# Patient Record
Sex: Male | Born: 2000 | Race: Black or African American | Hispanic: No | Smoking: Never smoker
Health system: Southern US, Community
[De-identification: ages and names within clinical notes are randomized; demographics above are authoritative.]

---

## 2021-02-06 ENCOUNTER — Ambulatory Visit (INDEPENDENT_AMBULATORY_CARE_PROVIDER_SITE_OTHER): Payer: 59

## 2021-02-06 ENCOUNTER — Other Ambulatory Visit: Payer: Self-pay

## 2021-02-06 ENCOUNTER — Ambulatory Visit
Admission: EM | Admit: 2021-02-06 | Discharge: 2021-02-06 | Disposition: A | Discharge status: Home / Self Care | Payer: 59 | Attending: Physician Assistant | Admitting: Physician Assistant

## 2021-02-06 DIAGNOSIS — R0781 Pleurodynia: Secondary | ICD-10-CM | POA: Diagnosis not present

## 2021-02-06 DIAGNOSIS — M25561 Pain in right knee: Secondary | ICD-10-CM

## 2021-02-06 DIAGNOSIS — W109XXA Fall (on) (from) unspecified stairs and steps, initial encounter: Secondary | ICD-10-CM | POA: Diagnosis not present

## 2021-02-06 DIAGNOSIS — W19XXXA Unspecified fall, initial encounter: Secondary | ICD-10-CM | POA: Diagnosis not present

## 2021-02-06 DIAGNOSIS — R0782 Intercostal pain: Secondary | ICD-10-CM | POA: Diagnosis not present

## 2021-02-06 NOTE — ED Triage Notes (Signed)
Pt states he fell down the stairs last night inside stairs, Left rib pain and right knee pain. C/O little SOB since then. Denies LOC

## 2021-02-06 NOTE — Discharge Instructions (Addendum)
-  Can use ibuprofen or Tylenol as needed for pain -Chest wall tenderness should be better in a few days.  No acute fractures noted. -Ice and warm compresses as needed for discomfort

## 2021-02-06 NOTE — ED Provider Notes (Signed)
[facility]    CSN: [id number] Arrival date & time: 02/06/21  0818      History   Chief Complaint Chief Complaint  Patient presents with   Rib Injury    left   Knee Pain    right    HPI Connor Allen is a 20 y.o. male.   Patient is a 20 year old male who presents with chief complaint of left rib pain and right knee pain after falling down a flight of stairs last night.  Patient dates she tripped and states he thinks her stair hitting him in the left side of the chest.  Patient denies any LOC.  Patient reports left rib pain is worse with deep breath and that his right knee does hurt worse with flexion and extension.   No past medical history on file.  There are no problems to display for this patient.  No relevant past medical history.   Home Medications    Prior to Admission medications   Not on File    Family History No family history on file.  Social History Social History   Tobacco Use   Smoking status: Never   Smokeless tobacco: Never  Vaping Use   Vaping Use: Never used  Substance Use Topics   Alcohol use: Not Currently   Drug use: Not Currently     Allergies   Patient has no allergy information on record.   Review of Systems Review of Systems as noted in HPI, other systems reviewed and found to be negative   Physical Exam Triage Vital Signs ED Triage Vitals  Enc Vitals Group     BP 02/06/21 0831 (!) 138/102     Pulse Rate 02/06/21 0831 77     Resp 02/06/21 0831 18     Temp 02/06/21 0831 98.6 F (37 C)     Temp Source 02/06/21 0831 Oral     SpO2 02/06/21 0831 100 %     Weight 02/06/21 0829 175 lb (79.4 kg)     Height 02/06/21 0829 5\' 9"  (1.753 m)     Head Circumference --      Peak Flow --      Pain Score 02/06/21 0828 6     Pain Loc --      Pain Edu? --      Excl. in GC? --    No data found.  Updated Vital Signs BP (!) 138/102 (BP Location: Right Arm)   Pulse 77   Temp 98.6 F (37 C) (Oral)   Resp 18   Ht 5'  9" (1.753 m)   Wt 175 lb (79.4 kg)   SpO2 100%   BMI 25.84 kg/m   Physical Exam Constitutional:      General: He is not in acute distress.    Appearance: Normal appearance. He is normal weight.  HENT:     Head: Normocephalic and atraumatic.  Cardiovascular:     Rate and Rhythm: Normal rate and regular rhythm.     Pulses: Normal pulses.  Pulmonary:     Effort: Pulmonary effort is normal. No respiratory distress.     Breath sounds: Normal breath sounds. No stridor.  Chest:    Musculoskeletal:        General: Normal range of motion.     Right knee: Normal.     Left knee: Normal.  Skin:    General: Skin is warm and dry.  Neurological:     Mental Status: He is alert.  UC Treatments / Results  Labs (all labs ordered are listed, but only abnormal results are displayed) Labs Reviewed - No data to display  EKG   Radiology DG Ribs Unilateral W/Chest Left  Result Date: 02/06/2021 CLINICAL DATA:  Fall down stairs, left rib pain EXAM: LEFT RIBS AND CHEST - 3+ VIEW COMPARISON:  None. FINDINGS: The cardiomediastinal silhouette is within normal limits. No pleural effusion. No pneumothorax. No mass or consolidation. No acute osseous abnormality. No displaced rib fracture. Small round metallic density overlying the left chest appears to be external to the patient. IMPRESSION: Normal chest radiograph.  No displaced rib fracture. Electronically Signed   By: Olive Bass M.D.   On: 02/06/2021 09:31   DG Knee AP/LAT W/Sunrise Right  Result Date: 02/06/2021 CLINICAL DATA:  Fall down stairs, right knee pain EXAM: RIGHT KNEE 3 VIEWS COMPARISON:  None. FINDINGS: Normal alignment. No acute fracture. Normal mineralization. The soft tissues are unremarkable. No knee joint effusion. IMPRESSION: No malalignment or acute fracture. Electronically Signed   By: Olive Bass M.D.   On: 02/06/2021 09:27    Procedures Procedures (including critical care time)  Medications Ordered in  UC Medications - No data to display  Initial Impression / Assessment and Plan / UC Course  I have reviewed the triage vital signs and the nursing notes.  Pertinent labs & imaging results that were available during my care of the patient were reviewed by me and considered in my medical decision making (see chart for details).  Clinical Course as of 02/06/21 0940  Mon Feb 06, 2021  3419 DG Ribs Unilateral W/Chest Left [MH]    Clinical Course User Index [MH] Candis Schatz, PA-C   Patient ports falling down a flight of stairs last night after tripping.  States he felt like one of the stairs to the middle left-sided chest.  Patient does report some right knee pain.  Left chest pain worse with with deep breaths and right knee pain worse with bending.  Exam fairly benign.  X-rays negative for acute injury.  Recommend over-the-counter medications for pain.  Ice and heat as needed.  Final Clinical Impressions(s) / UC Diagnoses   Final diagnoses:  Fall, initial encounter  Acute pain of right knee  Rib pain on left side     Discharge Instructions      -Can use ibuprofen or Tylenol as needed for pain -Chest wall tenderness should be better in a few days.  No acute fractures noted. -Ice and warm compresses as needed for discomfort      ED Prescriptions   None    PDMP not reviewed this encounter.   Candis Schatz, PA-C 02/06/21 [phone]

## 2021-06-22 ENCOUNTER — Ambulatory Visit: Payer: 59

## 2021-11-16 ENCOUNTER — Ambulatory Visit: Payer: Self-pay | Admitting: Family Medicine

## 2021-11-16 ENCOUNTER — Encounter: Payer: Self-pay | Admitting: Family Medicine

## 2021-11-16 DIAGNOSIS — Z113 Encounter for screening for infections with a predominantly sexual mode of transmission: Secondary | ICD-10-CM

## 2021-11-16 LAB — HM HIV SCREENING LAB: HM HIV Screening: NEGATIVE

## 2021-11-16 LAB — GRAM STAIN

## 2021-11-16 NOTE — Progress Notes (Signed)
[facility] Department STI clinic/screening visit  Subjective:  Connor Allen is a 21 y.o. male being seen today for an STI screening visit. The patient reports they do not have symptoms.    Patient has the following medical conditions:  There are no problems to display for this patient.    Chief Complaint  Patient presents with   SEXUALLY TRANSMITTED DISEASE    HPI  Patient reports here for screening, denies s/sx   Does the patient or their partner desires a pregnancy in the next year? No  Screening for MPX risk: Does the patient have an unexplained rash? No Is the patient MSM? No Does the patient endorse multiple sex partners or anonymous sex partners? No Did the patient have close or sexual contact with a person diagnosed with MPX? No Has the patient traveled outside the [place] where MPX is endemic? No Is there a high clinical suspicion for MPX-- evidenced by one of the following No  -Unlikely to be chickenpox  -Lymphadenopathy  -Rash that present in same phase of evolution on any given body part   See flowsheet for further details and programmatic requirements.    There is no immunization history on file for this patient.   The following portions of the patient's history were reviewed and updated as appropriate: allergies, current medications, past medical history, past social history, past surgical history and problem list.  Objective:  There were no vitals filed for this visit.  Physical Exam Constitutional:      Appearance: Normal appearance.  HENT:     Head: Normocephalic.     Mouth/Throat:     Mouth: Mucous membranes are moist.     Pharynx: Oropharynx is clear. No oropharyngeal exudate.  Pulmonary:     Effort: Pulmonary effort is normal.  Genitourinary:    Penis: Normal.      Testes: Normal.     Comments: No lice, nits, or pest, no lesions or odor discharge.  Denies pain or tenderness with paplation of testicles.  No lesions, ulcers or masses  present.    Musculoskeletal:     Cervical back: Normal range of motion.  Lymphadenopathy:     Cervical: No cervical adenopathy.  Skin:    General: Skin is warm and dry.     Findings: No bruising, erythema, lesion or rash.  Neurological:     Mental Status: He is alert.  Psychiatric:        Mood and Affect: Mood normal.        Behavior: Behavior normal.      Assessment and Plan:  Connor Allen is a 21 y.o. male presenting to the [facility] Department for STI screening  1. Screening examination for venereal disease Patient does not have STI symptoms Patient accepted all screenings including  gram stain,  oral, urethral GC and bloodwork for HIV/RPR.  Patient meets criteria for HepB screening? Yes. Ordered? No - declined  Patient meets criteria for HepC screening? Yes. Ordered? No - declined  Recommended condom use with all sex Discussed importance of condom use for STI prevent  Treat gram stain per standing order Discussed time line for [facility] results and that patient will be called with positive results and encouraged patient to call if he had not heard in 2 weeks Recommended returning for continued or worsening symptoms.   - Gonococcus culture - Gram stain - HIV [facility] - Syphilis Serology, [facility] - Gonococcus culture     No follow-ups on  file.  No future appointments.  Wendi Snipes, FNP

## 2021-11-16 NOTE — Progress Notes (Signed)
Pt here for STD screening.  Gram stain results reviewed, no treatment required per SO.  Amalya Salmons M Kashina Mecum, RN ° °

## 2021-11-20 LAB — GONOCOCCUS CULTURE

## 2022-03-22 IMAGING — CR DG RIBS W/ CHEST 3+V*L*
5 series · 5 of 5 positions shown · non-contrast
Comparison: None.

CLINICAL DATA: Fall down stairs, left rib pain

EXAM:
LEFT RIBS AND CHEST - 3+ VIEW

[chest pa]
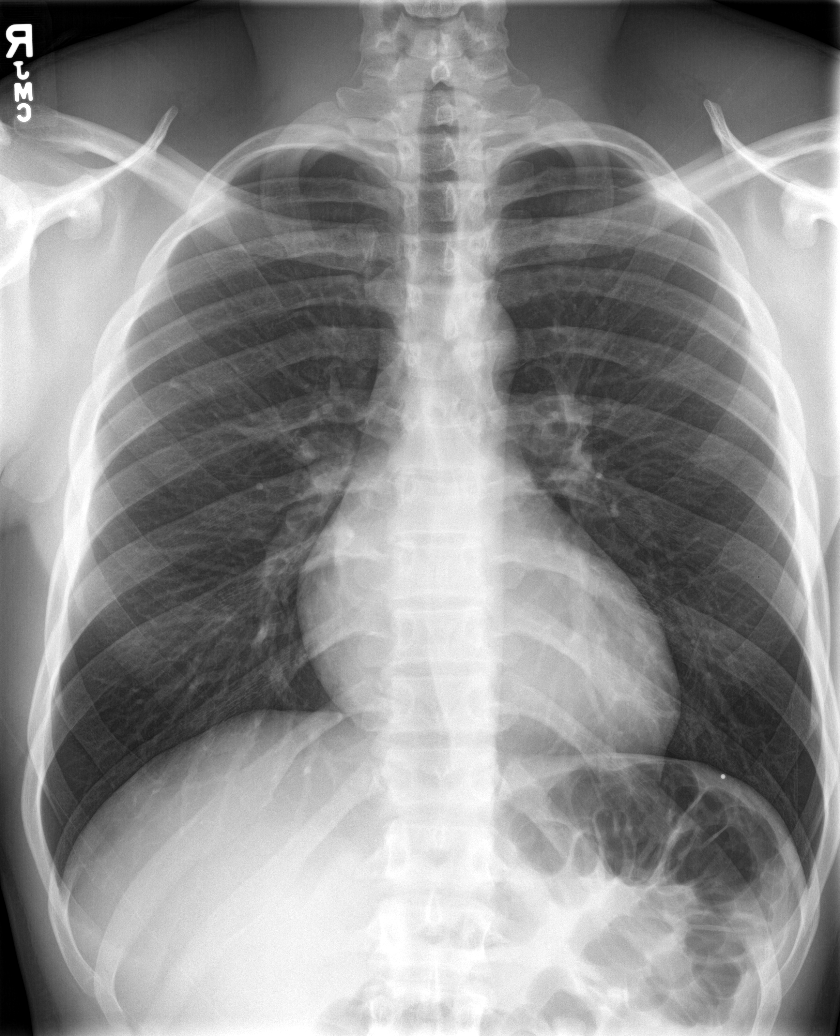

[rib pa (1 of 2)]
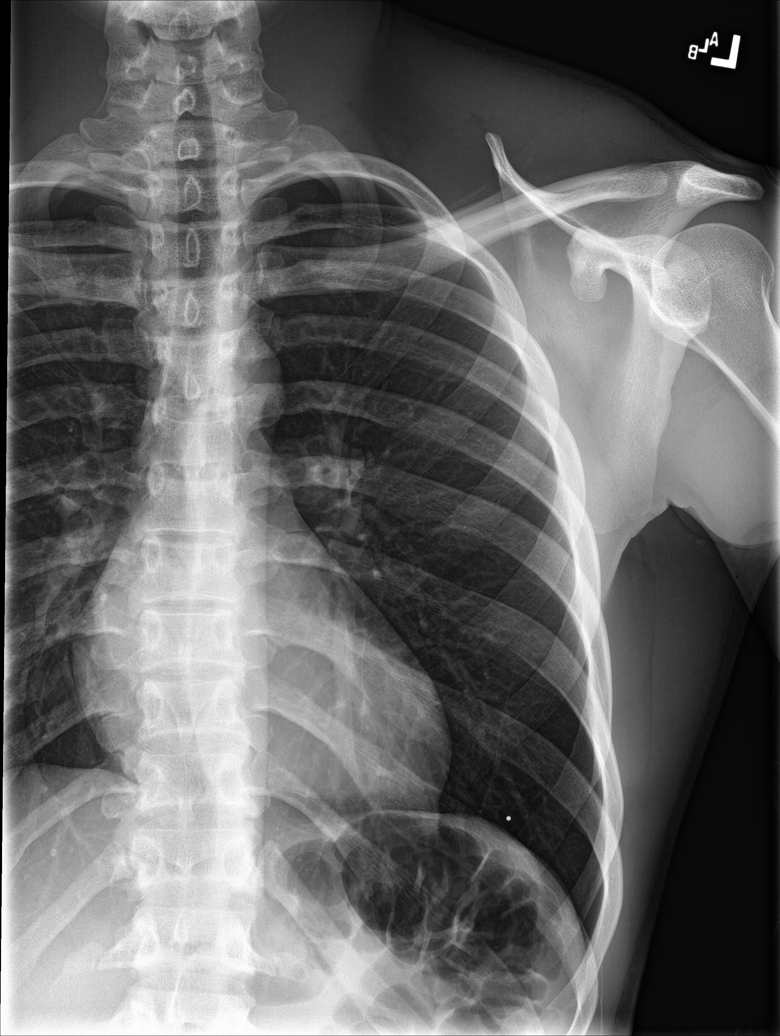

[rib pa (2 of 2)]
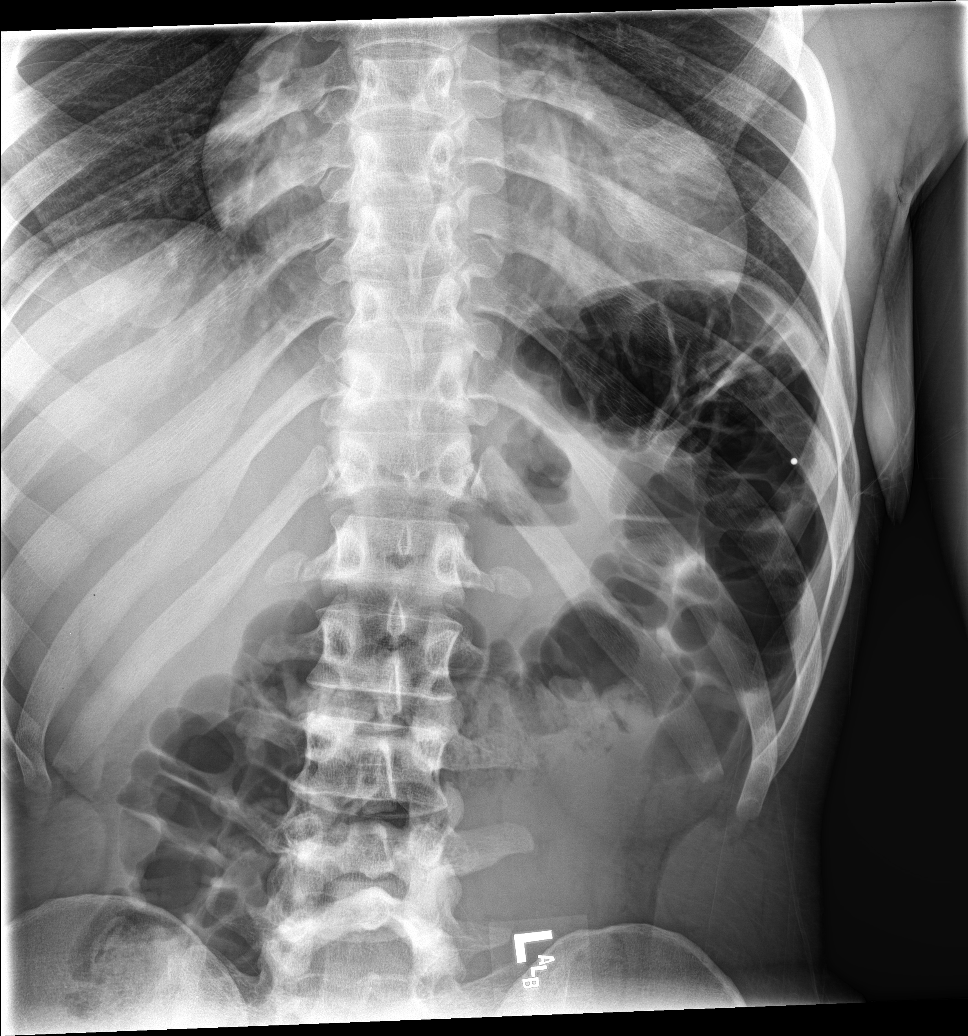

[rib obl (1 of 2)]
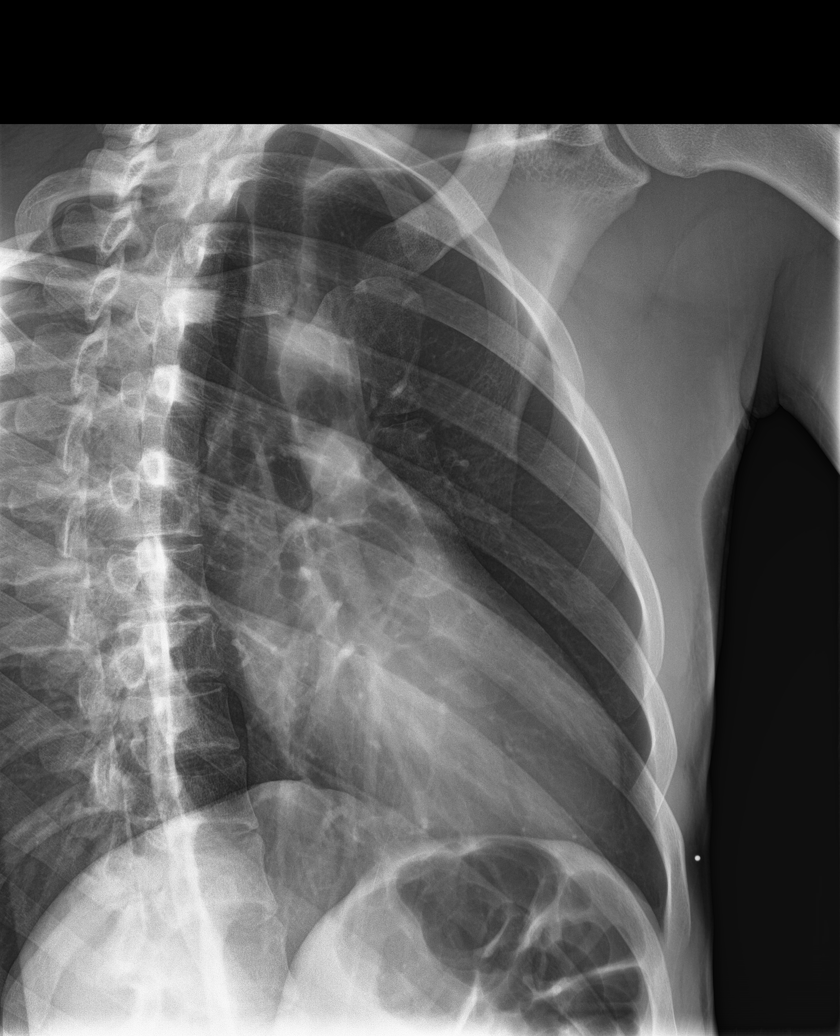

[rib obl (2 of 2)]
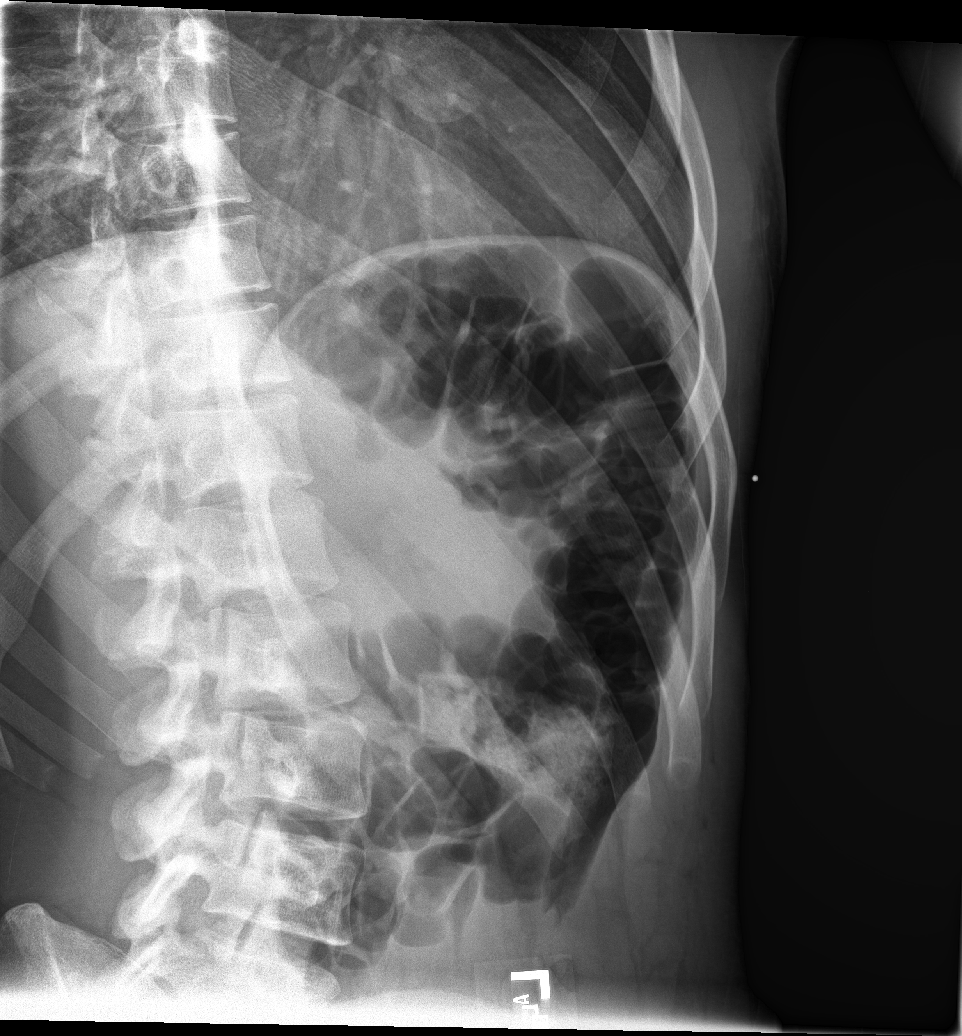

[5 of 5 positions shown; findings below may reference images not displayed]

FINDINGS: The cardiomediastinal silhouette is within normal limits. No pleural
effusion. No pneumothorax. No mass or consolidation. No acute
osseous abnormality. No displaced rib fracture. Small round metallic
density overlying the left chest appears to be external to the
patient.
IMPRESSION: Normal chest radiograph.  No displaced rib fracture.
